# Patient Record
Sex: Male | Born: 1975
Health system: Southern US, Community
[De-identification: ages and names within clinical notes are randomized; demographics above are authoritative.]

## PROBLEM LIST (undated history)

## (undated) HISTORY — PX: HYDROCELE EXCISION / REPAIR: SUR1145

---

## 2010-11-27 ENCOUNTER — Other Ambulatory Visit: Payer: Self-pay | Admitting: Family Medicine

## 2010-11-27 DIAGNOSIS — K769 Liver disease, unspecified: Secondary | ICD-10-CM

## 2010-12-14 ENCOUNTER — Other Ambulatory Visit: Payer: Self-pay

## 2011-05-31 ENCOUNTER — Emergency Department (INDEPENDENT_AMBULATORY_CARE_PROVIDER_SITE_OTHER): Payer: PRIVATE HEALTH INSURANCE

## 2011-05-31 ENCOUNTER — Encounter (HOSPITAL_BASED_OUTPATIENT_CLINIC_OR_DEPARTMENT_OTHER): Payer: Self-pay | Admitting: *Deleted

## 2011-05-31 ENCOUNTER — Emergency Department (HOSPITAL_BASED_OUTPATIENT_CLINIC_OR_DEPARTMENT_OTHER)
Admission: EM | Admit: 2011-05-31 | Discharge: 2011-05-31 | Disposition: A | Discharge status: Home / Self Care | Payer: PRIVATE HEALTH INSURANCE | Attending: Emergency Medicine | Admitting: Emergency Medicine

## 2011-05-31 DIAGNOSIS — R220 Localized swelling, mass and lump, head: Secondary | ICD-10-CM | POA: Insufficient documentation

## 2011-05-31 DIAGNOSIS — R1310 Dysphagia, unspecified: Secondary | ICD-10-CM | POA: Insufficient documentation

## 2011-05-31 DIAGNOSIS — J029 Acute pharyngitis, unspecified: Secondary | ICD-10-CM

## 2011-05-31 DIAGNOSIS — R221 Localized swelling, mass and lump, neck: Secondary | ICD-10-CM

## 2011-05-31 LAB — RAPID STREP SCREEN (MED CTR MEBANE ONLY): Streptococcus, Group A Screen (Direct): NEGATIVE

## 2011-05-31 MED ORDER — ONDANSETRON HCL 4 MG/2ML IJ SOLN
4.0000 mg | Freq: Once | INTRAMUSCULAR | Status: AC
  Administered 2011-05-31: 4 mg via INTRAVENOUS

## 2011-05-31 MED ORDER — LORAZEPAM 2 MG/ML IJ SOLN
1.0000 mg | Freq: Once | INTRAMUSCULAR | Status: AC
  Administered 2011-05-31: 1 mg via INTRAVENOUS

## 2011-05-31 MED ORDER — ONDANSETRON HCL 4 MG/2ML IJ SOLN
INTRAMUSCULAR | Status: AC
  Administered 2011-05-31: 4 mg via INTRAVENOUS
  Filled 2011-05-31: qty 2

## 2011-05-31 MED ORDER — LORAZEPAM 2 MG/ML IJ SOLN
INTRAMUSCULAR | Status: AC
  Administered 2011-05-31: 1 mg via INTRAVENOUS
  Filled 2011-05-31: qty 1

## 2011-05-31 MED ORDER — IOHEXOL 300 MG/ML  SOLN
80.0000 mL | Freq: Once | INTRAMUSCULAR | Status: AC | PRN
  Administered 2011-05-31: 80 mL via INTRAVENOUS

## 2011-05-31 NOTE — ED Notes (Signed)
Pt states he was eating an apple about an hr ago and leaned forward to work on his computer. "Felt snap and it was hard to swallow for awhile". No distress.

## 2011-05-31 NOTE — Discharge Instructions (Signed)

## 2011-05-31 NOTE — ED Notes (Signed)
Pt appears to be highly anxious, wishes to wait on the strep screen for now.

## 2011-05-31 NOTE — ED Provider Notes (Signed)
History   This chart was scribed for Rolan Bucco, MD by Charolett Bumpers . The patient was seen in room MH06/MH06 and the patient's care was started at 9:29pm.    CSN: [id number]  Arrival date & time 05/31/11  2023   First MD Initiated Contact with Patient 05/31/11 2119      Chief Complaint  Patient presents with  . Sore Throat    (Consider location/radiation/quality/duration/timing/severity/associated sxs/prior treatment) HPI Marco Miles is a 36 y.o. male who presents to the Emergency Department complaining of constant, moderate sore throat with associated cough, nasal congestion, drainage, and nausea for the past few days. Patient states that tonight, PTA, he was eating an apple and leaned forward when he heard a "cracking noise". Patient states that ever since, he has had difficulty swallowing and tightness in his throat. Patient states that his sore throat was made worse after the "crack" was heard. Patient denies having a h/o similar problems. Patient denies SOB, fever, v/d and abdominal pain. No pertinent medical problems reported. No other symptoms reported.    PCP: Dr. Zachery Dauer at [facility]   History reviewed. No pertinent past medical history.  Past Surgical History  Procedure Date  . Hydrocele excision / repair     History reviewed. No pertinent family history.  History  Substance Use Topics  . Smoking status: Never Smoker   . Smokeless tobacco: Not on file  . Alcohol Use: Yes      Review of Systems A complete 10 system review of systems was obtained and all systems are negative except as noted in the HPI and PMH.   Allergies  Sudafed  Home Medications   Current Outpatient Rx  Name Route Sig Dispense Refill  . DM-GUAIFENESIN ER 30-600 MG PO TB12 Oral Take 1 tablet by mouth every 12 (twelve) hours. For congestion    . OMEGA-3 FATTY ACIDS 1000 MG PO CAPS Oral Take 2 g by mouth 2 (two) times daily.    . MULTI-VITAMIN/MINERALS PO TABS Oral  Take 1 tablet by mouth 2 (two) times daily.      BP 139/90  Pulse 82  Temp(Src) 97.8 F (36.6 C) (Oral)  Resp 20  Ht 5\' 10"  (1.778 m)  Wt 198 lb (89.812 kg)  BMI 28.41 kg/m2  SpO2 97%  Physical Exam  Nursing note and vitals reviewed. Constitutional: He is oriented to person, place, and time. He appears well-developed and well-nourished. No distress.       Patient mildly anxious upon exam.   HENT:  Head: Normocephalic and atraumatic.  Right Ear: External ear normal.  Left Ear: External ear normal.  Nose: Nose normal.  Mouth/Throat: Oropharynx is clear and moist. No oropharyngeal exudate.       No erythema noted on oropharynx. TM's normal bilaterally. Midline tenderness to neck.   Eyes: Conjunctivae and EOM are normal. Pupils are equal, round, and reactive to light.  Neck: Normal range of motion. Neck supple. No tracheal deviation present.  Cardiovascular: Normal rate, regular rhythm and normal heart sounds.  Exam reveals no gallop and no friction rub.   No murmur heard. Pulmonary/Chest: Effort normal and breath sounds normal. No respiratory distress. He has no wheezes. He has no rales.       No stridor.   Abdominal: Soft. Bowel sounds are normal. He exhibits no distension. There is no tenderness.  Musculoskeletal: Normal range of motion. He exhibits no edema.  Lymphadenopathy:    He has no cervical adenopathy.  Neurological: He  is alert and oriented to person, place, and time. No cranial nerve deficit or sensory deficit.  Skin: Skin is warm and dry.  Psychiatric: He has a normal mood and affect. His behavior is normal.    ED Course  Procedures (including critical care time)  DIAGNOSTIC STUDIES: Oxygen Saturation is 97% on room air, normal by my interpretation.    COORDINATION OF CARE:  2133: Discussed planned course of treatment with the patient who is agreeable at this time. Will order CT scan.     Labs Reviewed  RAPID STREP SCREEN   Ct Soft Tissue Neck W  Contrast  05/31/2011  *RADIOLOGY REPORT*  Clinical Data: Sore throat, difficulty swallowing, neck swelling  CT NECK WITH CONTRAST  Technique:  Multidetector CT imaging of the neck was performed with intravenous contrast.  Contrast: 80mL OMNIPAQUE IOHEXOL 300 MG/ML  SOLN  Comparison: None.  Findings: The visualized pharyngeal soft tissues are unremarkable. No evidence of tonsillar/peritonsillar abscess.  The airway remains patent.  No suspicious cervical lymphadenopathy.  The visualized thyroid is unremarkable.  The cervical spine is within normal limits.  The visualized lung apices are clear.  Tiny mucous retention cysts in the bilateral maxillary sinuses. The visualized paranasal sinuses and mastoid air cells are otherwise clear.  The visualized brain parenchyma is within normal limits.  IMPRESSION: Normal CT neck.  Original Report Authenticated By: Charline Bills, M.D.     1. Sore throat       MDM  No evidence of airway edema, infection or other neck abnormality.  Feel that pt's anxiety is contributing to symptoms   I personally performed the services described in this documentation, which was scribed in my presence.  The recorded information has been reviewed and considered.       Rolan Bucco, MD 05/31/11 2306

## 2011-07-11 ENCOUNTER — Ambulatory Visit: Payer: PRIVATE HEALTH INSURANCE | Admitting: Physical Therapy

## 2011-12-18 DIAGNOSIS — L739 Follicular disorder, unspecified: Secondary | ICD-10-CM | POA: Insufficient documentation

## 2013-10-12 IMAGING — CT CT NECK W/ CM
5 series · 16 of 33 positions shown, 18 images · IV contrast (omnipaque)
Comparison: None.

CLINICAL DATA: Sore throat, difficulty swallowing, neck swelling

CT NECK WITH CONTRAST
TECHNIQUE: Multidetector CT imaging of the neck was performed with
intravenous contrast.
Contrast: 80mL OMNIPAQUE IOHEXOL 300 MG/ML  SOLN

[Series 2: neck 2.0 b31s · axial · 0.46mm/px · z∈[-260,-176]mm · 2 of 126 slices shown (1 of 2)]
[im 42/126  bone]
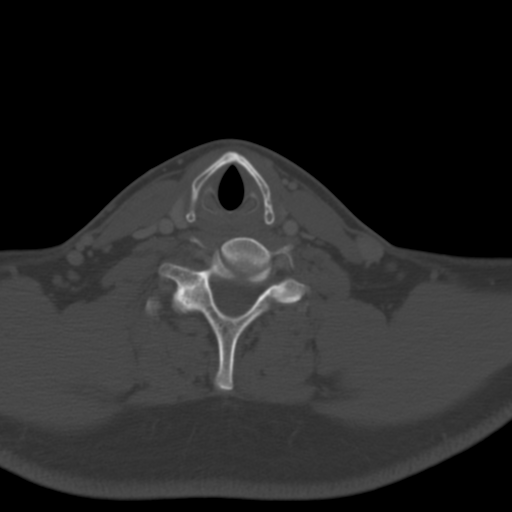
[im 84/126  bone]
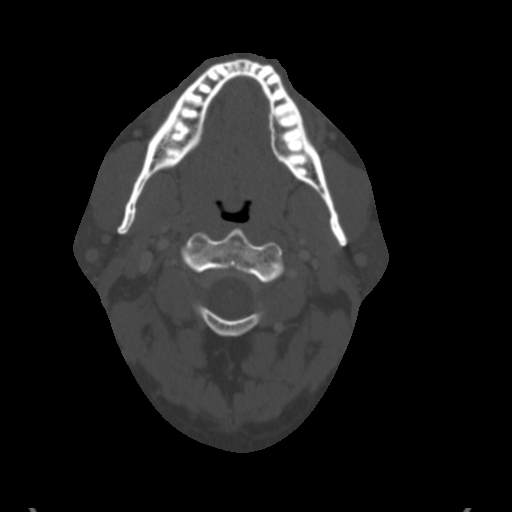

[Series 4: neck 2.0 coronal · coronal · 0.51mm/px · 3 of 112 slices shown]
[im 23/112  bone]
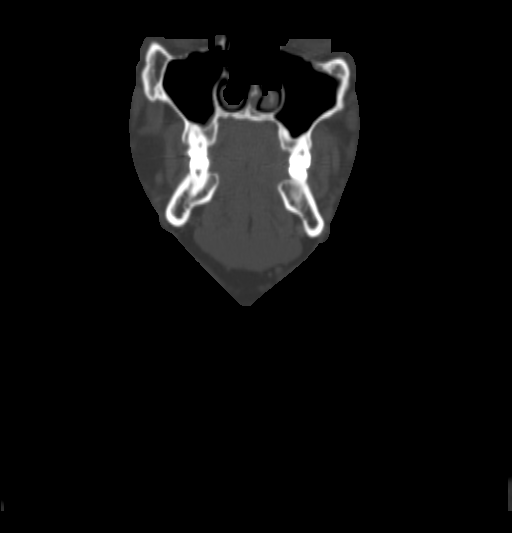
[im 45/112  bone]
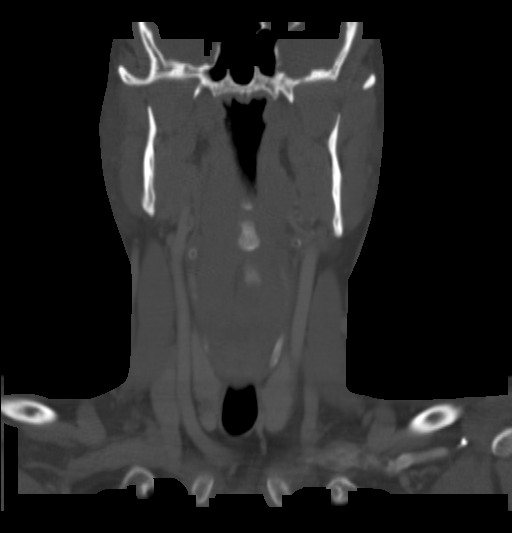
[im 67/112  bone]
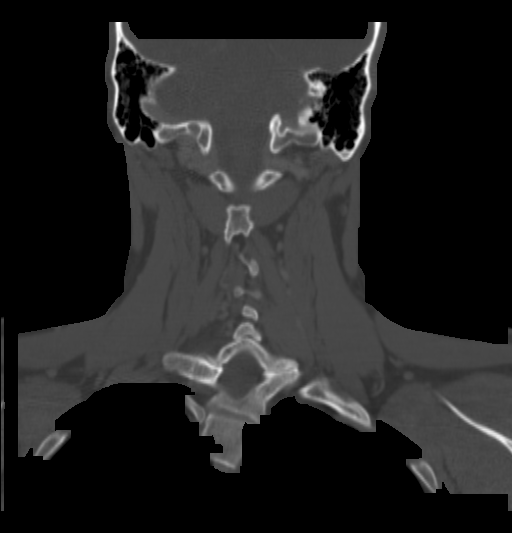

[Series 5: neck 2.0 sagittal · sagittal · 0.54mm/px · 5 of 84 slices shown, 6 images]
[im 28/84  bone]
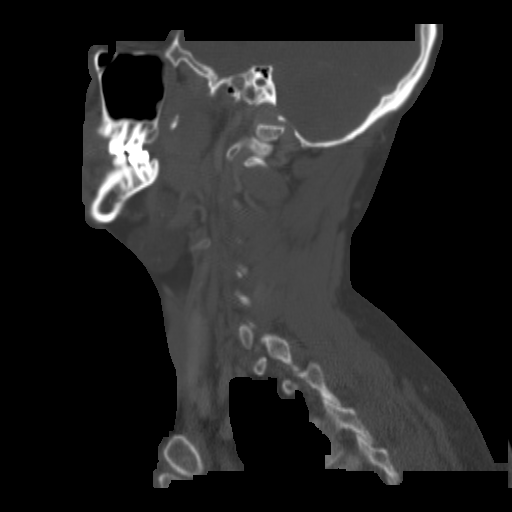
[im 35/84  bone]
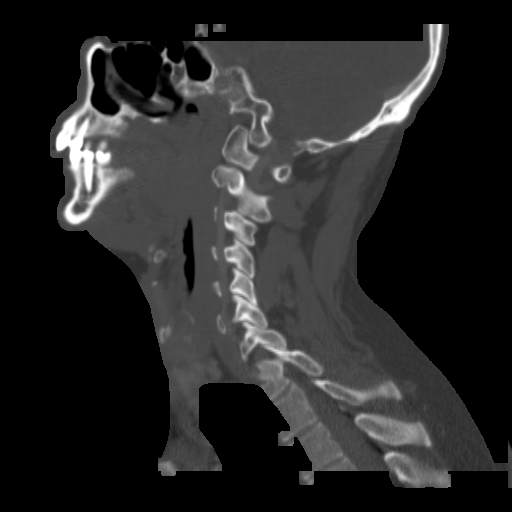
[im 42/84  soft-tissue]
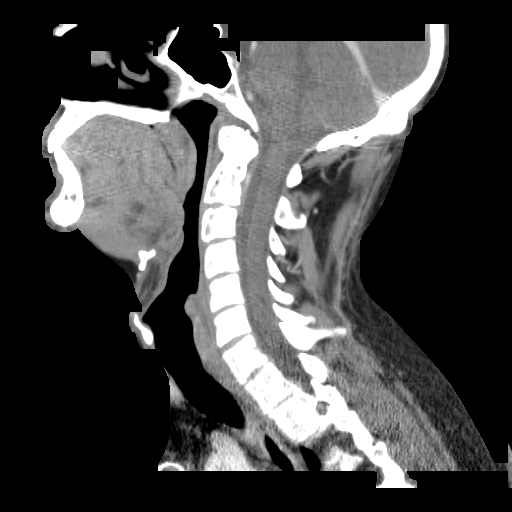
[im 42/84  bone]
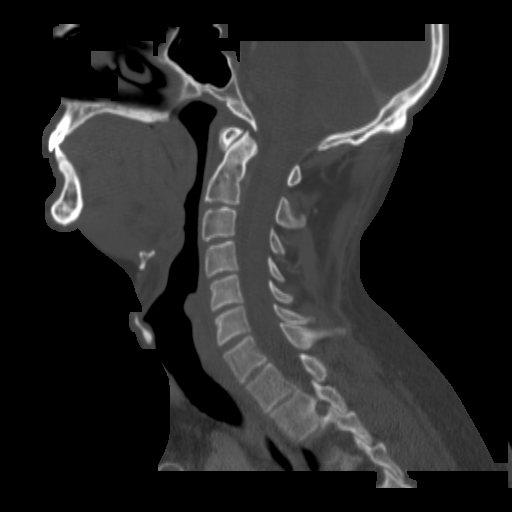
[im 49/84  bone]
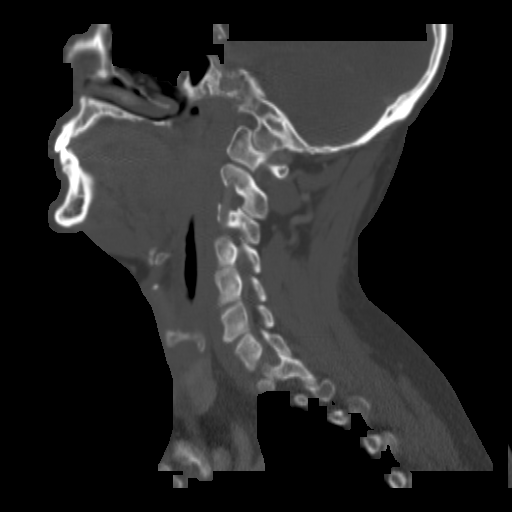
[im 56/84  bone]
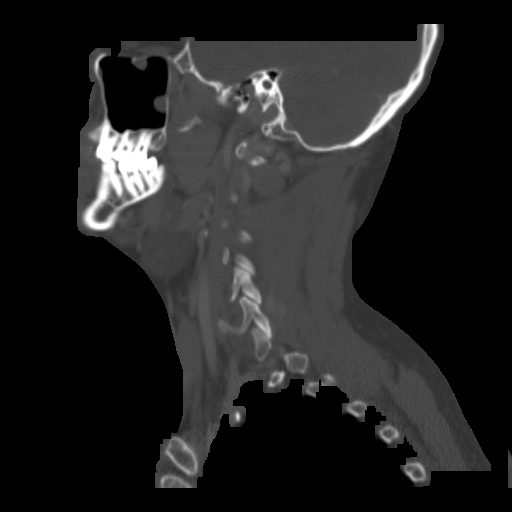

[Series 7: neck 2.0 orth axial to hyoid · axial · 0.39mm/px · z∈[-317,-191]mm · 3 of 138 slices shown, 4 images]
[im 35/138  soft-tissue]
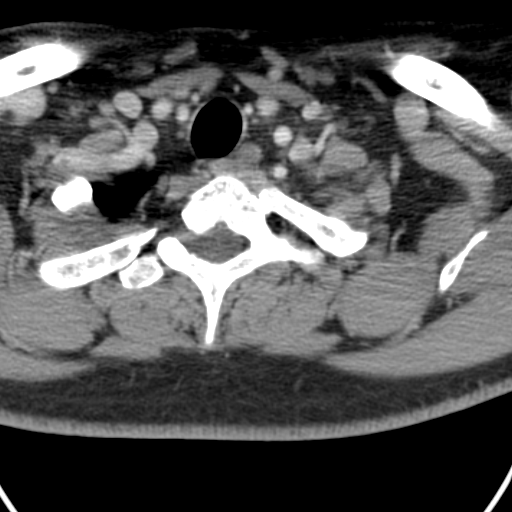
[im 35/138  bone]
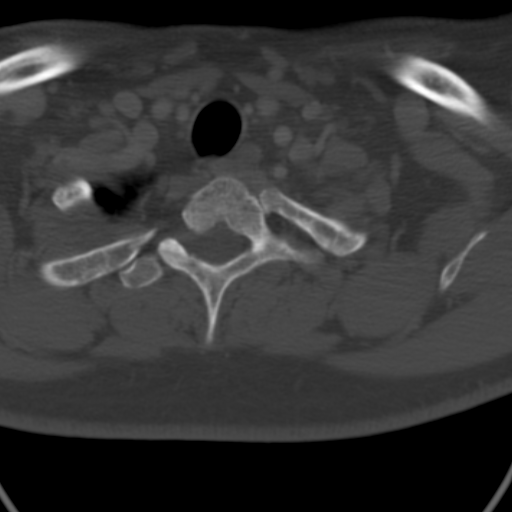
[im 69/138  bone]
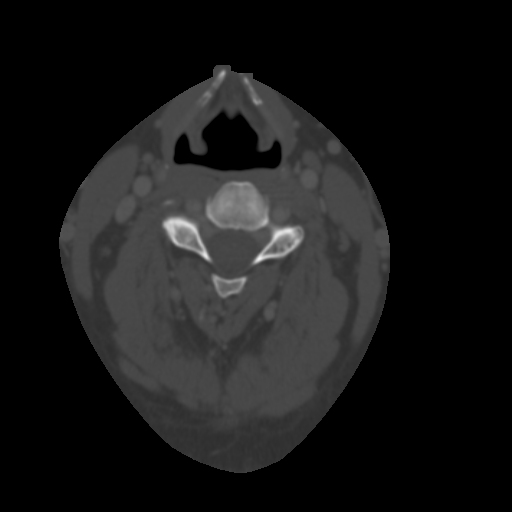
[im 103/138  bone]
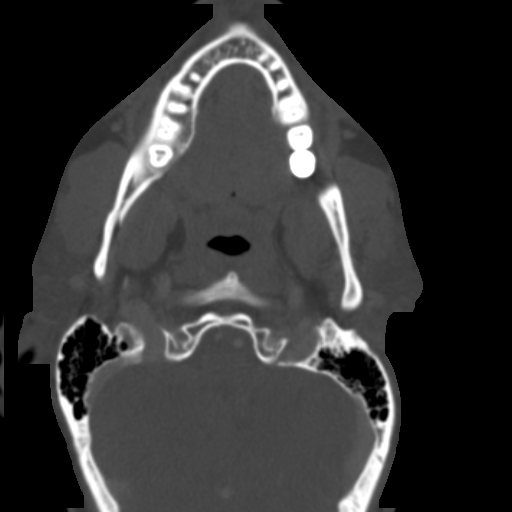

[Series 8: neck 2.0 b31s · axial · 0.54mm/px · z∈[-280,-156]mm · 3 of 126 slices shown (2 of 2)]
[im 32/126  bone]
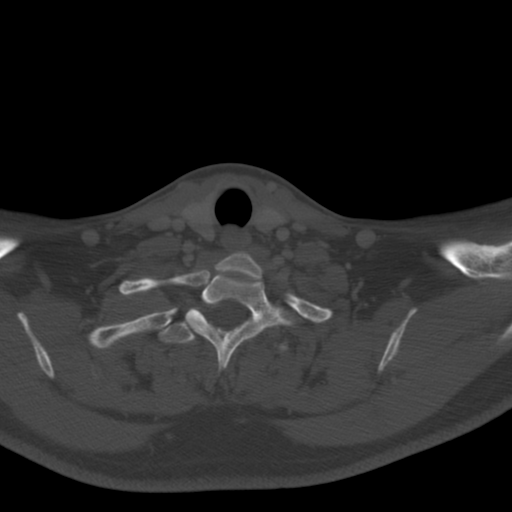
[im 63/126  bone]
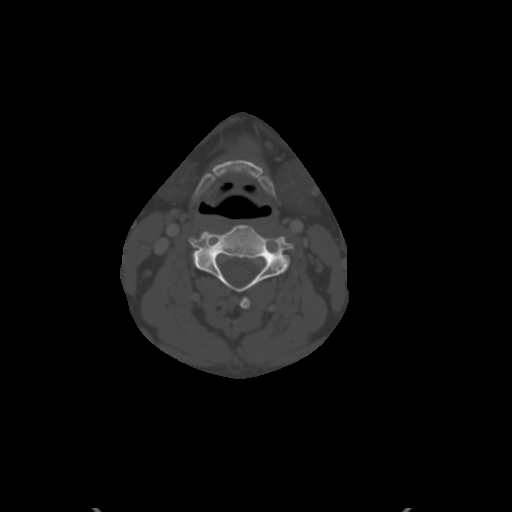
[im 94/126  bone]
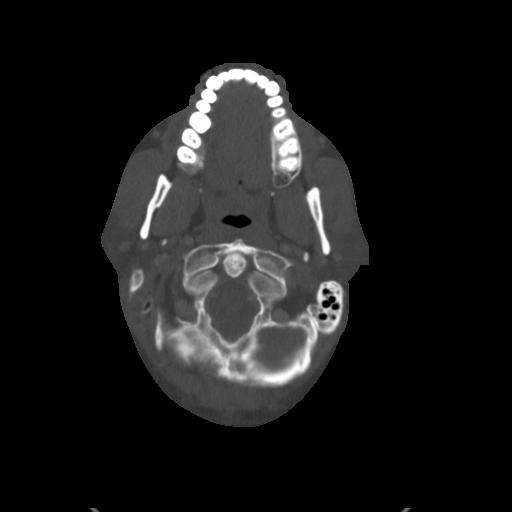

[16 of 33 positions shown; findings below may reference images not displayed]

FINDINGS: The visualized pharyngeal soft tissues are unremarkable.
No evidence of tonsillar/peritonsillar abscess.

The airway remains patent.

No suspicious cervical lymphadenopathy.

The visualized thyroid is unremarkable.

The cervical spine is within normal limits.

The visualized lung apices are clear.

Tiny mucous retention cysts in the bilateral maxillary sinuses.
The visualized paranasal sinuses and mastoid air cells are
otherwise clear.

The visualized brain parenchyma is within normal limits.
IMPRESSION: Normal CT neck.

## 2018-12-03 ENCOUNTER — Other Ambulatory Visit: Payer: Self-pay

## 2018-12-03 ENCOUNTER — Ambulatory Visit: Payer: Commercial Managed Care - PPO | Admitting: Podiatry

## 2018-12-03 ENCOUNTER — Encounter: Payer: Self-pay | Admitting: Podiatry

## 2018-12-03 DIAGNOSIS — B070 Plantar wart: Secondary | ICD-10-CM

## 2018-12-03 DIAGNOSIS — L309 Dermatitis, unspecified: Secondary | ICD-10-CM | POA: Diagnosis not present

## 2018-12-03 MED ORDER — FLUOROURACIL 5 % EX CREA: TOPICAL_CREAM | Freq: Two times a day (BID) | CUTANEOUS | 1 refills | Status: DC

## 2018-12-03 NOTE — Progress Notes (Signed)
   Subjective:    Patient ID: Marco Miles, male    DOB: 18-Feb-1975, 43 y.o.   MRN: [id number]  HPI    Review of Systems  All other systems reviewed and are negative.      Objective:   Physical Exam        Assessment & Plan:

## 2018-12-06 NOTE — Progress Notes (Signed)
Subjective:   Patient ID: Marco Miles, male   DOB: 43 y.o.   MRN: [id number]   HPI Patient presents stating he has had a lot of pain in the bottom of his left foot and also had some discoloration between several digits that they are starting cream.  States the bottom of the heel is very tender and its been going on now for around a year and gradual gotten worse over that time and patient does not smoke likes to be active   Review of Systems  All other systems reviewed and are negative.       Objective:  Physical Exam Vitals signs and nursing note reviewed.  Constitutional:      Appearance: He is well-developed.  Pulmonary:     Effort: Pulmonary effort is normal.  Musculoskeletal: Normal range of motion.  Skin:    General: Skin is warm.  Neurological:     Mental Status: He is alert.     Neurovascular status was found to be intact muscle strength was found to be adequate range of motion within normal limits.  Patient is noted to have a large lesion plantar aspect left which measures approximately centimeter by centimeter that upon debridement shows pinpoint bleeding pain to lateral pressure.  There is also slight discoloration between several digits but it is localized with no proximal edema erythema drainage noted and patient has good digital perfusion well oriented x3     Assessment:  Probability for verruca plantaris plantar aspect left with possibility for slight fungal infection between the interdigital area     Plan:  H&P all conditions reviewed discussed the written focus on the heel as he will start the medicines for between his toe prescribed by dermatologist.  I went ahead today debrided the tissue with sharp sterile instrumentation and then applied chemical agent to create immune response along with sterile dressing and gave instructions what to do if any blistering were to occur.  Patient also was given prescription for Efudex to begin using and will be seen back again in  1 month and probably will require several treatments and possible excision in future

## 2018-12-30 ENCOUNTER — Ambulatory Visit: Payer: Commercial Managed Care - PPO | Admitting: Podiatry

## 2018-12-30 ENCOUNTER — Encounter: Payer: Self-pay | Admitting: Podiatry

## 2018-12-30 ENCOUNTER — Other Ambulatory Visit: Payer: Self-pay

## 2018-12-30 DIAGNOSIS — B070 Plantar wart: Secondary | ICD-10-CM

## 2019-01-03 NOTE — Progress Notes (Signed)
Subjective:   Patient ID: Marco Miles, male   DOB: 43 y.o.   MRN: [id number]   HPI Patient states it seems to be improving and states that it is slowly getting better is not as painful as it was but I still know it is there    ROS      Objective:  Physical Exam  Neurovascular status intact with patient plantar aspect left heel showing a keratotic lesion that is improving but still has indications of verrucous material     Assessment:  Verruca plantaris left plantar heel medial side that is improving but present     Plan:  Sterile debridement of the lesion accomplished and I then went ahead and applied immune agent to create reaction along with sterile dressing and advised on leaving this on 24 hours and taken off earlier if any blistering which were to occur and what to do with blister were to occur.  I then went ahead and recommended continued Efudex usage at home and will be seen back as needed

## 2019-07-01 DIAGNOSIS — M9902 Segmental and somatic dysfunction of thoracic region: Secondary | ICD-10-CM | POA: Diagnosis not present

## 2019-07-01 DIAGNOSIS — M9906 Segmental and somatic dysfunction of lower extremity: Secondary | ICD-10-CM | POA: Diagnosis not present

## 2019-07-01 DIAGNOSIS — M9905 Segmental and somatic dysfunction of pelvic region: Secondary | ICD-10-CM | POA: Diagnosis not present

## 2019-07-01 DIAGNOSIS — M9903 Segmental and somatic dysfunction of lumbar region: Secondary | ICD-10-CM | POA: Diagnosis not present

## 2019-12-07 DIAGNOSIS — M9901 Segmental and somatic dysfunction of cervical region: Secondary | ICD-10-CM | POA: Diagnosis not present

## 2019-12-07 DIAGNOSIS — M9902 Segmental and somatic dysfunction of thoracic region: Secondary | ICD-10-CM | POA: Diagnosis not present

## 2019-12-07 DIAGNOSIS — M9905 Segmental and somatic dysfunction of pelvic region: Secondary | ICD-10-CM | POA: Diagnosis not present

## 2019-12-07 DIAGNOSIS — M9903 Segmental and somatic dysfunction of lumbar region: Secondary | ICD-10-CM | POA: Diagnosis not present

## 2019-12-21 DIAGNOSIS — M9901 Segmental and somatic dysfunction of cervical region: Secondary | ICD-10-CM | POA: Diagnosis not present

## 2019-12-21 DIAGNOSIS — M9902 Segmental and somatic dysfunction of thoracic region: Secondary | ICD-10-CM | POA: Diagnosis not present

## 2019-12-21 DIAGNOSIS — M9903 Segmental and somatic dysfunction of lumbar region: Secondary | ICD-10-CM | POA: Diagnosis not present

## 2019-12-21 DIAGNOSIS — M9905 Segmental and somatic dysfunction of pelvic region: Secondary | ICD-10-CM | POA: Diagnosis not present

## 2019-12-25 DIAGNOSIS — M791 Myalgia, unspecified site: Secondary | ICD-10-CM | POA: Diagnosis not present

## 2019-12-25 DIAGNOSIS — R5383 Other fatigue: Secondary | ICD-10-CM | POA: Diagnosis not present

## 2019-12-25 DIAGNOSIS — R002 Palpitations: Secondary | ICD-10-CM | POA: Diagnosis not present

## 2020-01-18 DIAGNOSIS — M9901 Segmental and somatic dysfunction of cervical region: Secondary | ICD-10-CM | POA: Diagnosis not present

## 2020-01-18 DIAGNOSIS — M9905 Segmental and somatic dysfunction of pelvic region: Secondary | ICD-10-CM | POA: Diagnosis not present

## 2020-01-18 DIAGNOSIS — M9902 Segmental and somatic dysfunction of thoracic region: Secondary | ICD-10-CM | POA: Diagnosis not present

## 2020-01-18 DIAGNOSIS — M9903 Segmental and somatic dysfunction of lumbar region: Secondary | ICD-10-CM | POA: Diagnosis not present

## 2020-01-23 ENCOUNTER — Other Ambulatory Visit: Payer: Self-pay

## 2020-01-23 ENCOUNTER — Ambulatory Visit (INDEPENDENT_AMBULATORY_CARE_PROVIDER_SITE_OTHER): Payer: BC Managed Care – PPO | Admitting: Podiatry

## 2020-01-23 ENCOUNTER — Encounter: Payer: Self-pay | Admitting: Podiatry

## 2020-01-23 DIAGNOSIS — B070 Plantar wart: Secondary | ICD-10-CM

## 2020-01-25 NOTE — Progress Notes (Signed)
Subjective:   Patient ID: Marco Miles, male   DOB: 44 y.o.   MRN: [id number]   HPI Patient states he has developed a lot of lesions on the bottom of his right foot that have been painful and they are much thinner than the ones we had worked on previously which did go away on his foot   ROS      Objective:  Physical Exam  Neurovascular status intact with lesions are mostly on the plantar aspect of the right hallux and into the metatarsal localized in nature with no other pathology noted     Assessment:  Verruca plantaris plantar aspect right     Plan:  H&P reviewed condition debrided the lesions and applied chemical agent to create response with sterile dressing.  Continue home Efudex usage and explained what to do if any blistering were to occur and reappoint if symptoms were to remain

## 2020-01-26 DIAGNOSIS — J22 Unspecified acute lower respiratory infection: Secondary | ICD-10-CM | POA: Diagnosis not present

## 2020-01-26 DIAGNOSIS — R059 Cough, unspecified: Secondary | ICD-10-CM | POA: Diagnosis not present

## 2020-02-06 DIAGNOSIS — H40013 Open angle with borderline findings, low risk, bilateral: Secondary | ICD-10-CM | POA: Diagnosis not present

## 2020-02-15 DIAGNOSIS — M9901 Segmental and somatic dysfunction of cervical region: Secondary | ICD-10-CM | POA: Diagnosis not present

## 2020-02-15 DIAGNOSIS — M9902 Segmental and somatic dysfunction of thoracic region: Secondary | ICD-10-CM | POA: Diagnosis not present

## 2020-02-15 DIAGNOSIS — M9905 Segmental and somatic dysfunction of pelvic region: Secondary | ICD-10-CM | POA: Diagnosis not present

## 2020-02-15 DIAGNOSIS — M9903 Segmental and somatic dysfunction of lumbar region: Secondary | ICD-10-CM | POA: Diagnosis not present

## 2020-05-16 DIAGNOSIS — H02889 Meibomian gland dysfunction of unspecified eye, unspecified eyelid: Secondary | ICD-10-CM | POA: Diagnosis not present

## 2021-01-29 DIAGNOSIS — H40013 Open angle with borderline findings, low risk, bilateral: Secondary | ICD-10-CM | POA: Diagnosis not present

## 2021-11-28 ENCOUNTER — Ambulatory Visit: Payer: 59 | Admitting: Podiatry

## 2021-11-29 ENCOUNTER — Encounter: Payer: Self-pay | Admitting: Podiatry

## 2021-11-29 ENCOUNTER — Ambulatory Visit (INDEPENDENT_AMBULATORY_CARE_PROVIDER_SITE_OTHER): Payer: 59 | Admitting: Podiatry

## 2021-11-29 DIAGNOSIS — B351 Tinea unguium: Secondary | ICD-10-CM

## 2021-11-29 NOTE — Progress Notes (Signed)
Subjective:   Patient ID: Marco Miles, male   DOB: 46 y.o.   MRN: [id number]   HPI Patient presents stating he had concerns about his nails being brittle and discoloration and wants to make sure he is not getting fungus eft   ROS      Objective:  Physical Exam  Neurovascular status was found to be intact muscle strength was adequate with patient's nailbed right over left hallux showing some distal debris but localized no other pathology noted     Assessment:  Probability that this is more related to nail trauma versus a issue of fungal infection even though there may be slight fungal infection present     Plan:  Reviewed condition and recommended that he just go conservative and just watch it but I do not think it will be an issue for him.  Patient encouraged to call with questions concerns which may arise

## 2021-12-04 ENCOUNTER — Ambulatory Visit: Payer: 59 | Admitting: Podiatry

## 2022-10-14 ENCOUNTER — Other Ambulatory Visit (HOSPITAL_COMMUNITY): Payer: Self-pay | Admitting: Internal Medicine

## 2022-10-14 DIAGNOSIS — E7800 Pure hypercholesterolemia, unspecified: Secondary | ICD-10-CM

## 2022-11-07 ENCOUNTER — Ambulatory Visit (HOSPITAL_BASED_OUTPATIENT_CLINIC_OR_DEPARTMENT_OTHER)
Admission: RE | Admit: 2022-11-07 | Discharge: 2022-11-07 | Disposition: A | Discharge status: Home / Self Care | Payer: PRIVATE HEALTH INSURANCE | Source: Ambulatory Visit | Attending: Internal Medicine | Admitting: Internal Medicine

## 2022-11-07 DIAGNOSIS — E7800 Pure hypercholesterolemia, unspecified: Secondary | ICD-10-CM | POA: Insufficient documentation

## 2022-11-19 ENCOUNTER — Ambulatory Visit (INDEPENDENT_AMBULATORY_CARE_PROVIDER_SITE_OTHER): Payer: 59 | Admitting: Podiatry

## 2022-11-19 ENCOUNTER — Encounter: Payer: Self-pay | Admitting: Podiatry

## 2022-11-19 VITALS — Ht 70.0 in | Wt 198.0 lb

## 2022-11-19 DIAGNOSIS — S90112A Contusion of left great toe without damage to nail, initial encounter: Secondary | ICD-10-CM

## 2022-11-19 DIAGNOSIS — L600 Ingrowing nail: Secondary | ICD-10-CM

## 2022-11-19 NOTE — Progress Notes (Signed)
Subjective:   Patient ID: Marco Miles, male   DOB: 47 y.o.   MRN: [id number]   HPI Patient presents stating he traumatized his left big toenail and his right big toenail has been ingrown and gets sore in the corner.  States he is tried to trim and soak it and has not been affected   ROS      Objective:  Physical Exam  Neurovascular status intact with an incurvated medial border right hallux that is painful when pressed down to the base no erythema edema or drainage noted and on the left is found to have an irritated nailbed that he trimmed too deeply with moderate distal contusion     Assessment:  Ingrown toenail deformity right hallux with pain mild contusion left big toe and history of mild fungal infection between the digits     Plan:  H&P reviewed all conditions.  For the right I have recommended correction of deformity explained procedure risk he signed consent form I infiltrated the right big toe 60 mg like Marcaine mixture sterile prep done using sterile instrumentation remove the border exposed matrix applied phenol 3 applications 30 seconds followed by alcohol lavage sterile dressing gave instructions on soaks and to wear dressing for 24 hours take it off earlier if throbbing were to occur.  Did advise Lamisil cream and the left hallux should heal uneventfully

## 2022-11-19 NOTE — Patient Instructions (Signed)

## 2022-12-24 ENCOUNTER — Ambulatory Visit (INDEPENDENT_AMBULATORY_CARE_PROVIDER_SITE_OTHER): Payer: 59 | Admitting: Otolaryngology

## 2022-12-24 ENCOUNTER — Encounter (INDEPENDENT_AMBULATORY_CARE_PROVIDER_SITE_OTHER): Payer: Self-pay | Admitting: Otolaryngology

## 2022-12-24 VITALS — BP 126/80 | HR 68 | Ht 70.0 in | Wt 175.0 lb

## 2022-12-24 DIAGNOSIS — J339 Nasal polyp, unspecified: Secondary | ICD-10-CM | POA: Diagnosis not present

## 2022-12-24 DIAGNOSIS — J342 Deviated nasal septum: Secondary | ICD-10-CM

## 2022-12-24 DIAGNOSIS — G4733 Obstructive sleep apnea (adult) (pediatric): Secondary | ICD-10-CM | POA: Diagnosis not present

## 2022-12-24 DIAGNOSIS — R0982 Postnasal drip: Secondary | ICD-10-CM | POA: Diagnosis not present

## 2022-12-24 DIAGNOSIS — J343 Hypertrophy of nasal turbinates: Secondary | ICD-10-CM

## 2022-12-24 DIAGNOSIS — R0981 Nasal congestion: Secondary | ICD-10-CM

## 2022-12-24 DIAGNOSIS — J3489 Other specified disorders of nose and nasal sinuses: Secondary | ICD-10-CM | POA: Diagnosis not present

## 2022-12-24 DIAGNOSIS — K219 Gastro-esophageal reflux disease without esophagitis: Secondary | ICD-10-CM

## 2022-12-24 DIAGNOSIS — J3089 Other allergic rhinitis: Secondary | ICD-10-CM

## 2022-12-24 MED ORDER — FLUTICASONE PROPIONATE 50 MCG/ACT NA SUSP: 2.0000 | Freq: Every day | NASAL | 6 refills | Status: DC

## 2022-12-24 MED ORDER — CETIRIZINE HCL 10 MG PO TABS: 10.0000 mg | ORAL_TABLET | Freq: Every day | ORAL | 11 refills | Status: AC

## 2022-12-24 NOTE — Progress Notes (Signed)
ENT Consult Note   Discussed the use of AI scribe software for clinical note transcription with the patient, who gave verbal consent to proceed.  History of Present Illness   The patient is a 5 yoM, previously diagnosed with sleep apnea, presented for a consultation regarding his struggles with CPAP therapy and chronic nasal congestion.  The initial diagnosis of OSA was made in April, following a sleep study that revealed an AHI of 56 (HST ordered by PCP and done at [facility]). The patient reported that this was his first formal diagnosis of sleep apnea, although he had taken a home sleep test approximately 10-12 years prior which indicated some sleep interruptions. At that time, he lost a significant amount of weight and did not pursue further treatment.  However, during a routine checkup in March 2024, the patient's blood pressure was found to be significantly elevated. Despite weight loss, his blood pressure remained high, leading to the recent sleep study and diagnosis of sleep apnea. Since late September, the patient has been using CPAP therapy consistently, mostly for at least four hours each night. He reported feeling better since starting the therapy and uses a nasal cannula/pillow mask due to claustrophobia and discomfort with a full mask.  The patient also mentioned a recent visit to an orthotics specialist who suggested he might have a deviated septum, leading the patient to seek consultation with an ENT specialist. The patient expressed interest in exploring alternatives to CPAP therapy, particularly due to the potential long-term commitment to the CPAP machine 2/2 his young age.  In addition to sleep apnea, the patient has a history of seasonal and environmental allergies, which he manages with occasional saline spray. The patient denied any history of sinus infections, postnasal drainage, or poor sense of smell. He is currently on amlodipine for blood pressure management and does not  take any other medications.      Past Medical/Surgical History History reviewed. No pertinent past medical history.   His  has a past surgical history that includes Hydrocele excision / repair.  Past Family/Social History His family history is not on file. He  reports that he has never smoked. He has never used smokeless tobacco. He reports current alcohol use. He reports that he does not use drugs.  Medications/Allergies/Immunizations Allergies: Pseudoephedrine and Sudafed [pseudoephedrine hcl], Immunizations:  There is no immunization history on file for this patient.  Review of Systems  ROS: Constitutional: Negative for fever, weight loss and weight gain. Cardiovascular: Negative for chest pain and dyspnea on exertion. Respiratory: Is not experiencing shortness of breath at rest. Gastrointestinal: Negative for nausea and vomiting. Neurological: Negative for headaches. Psychiatric: The patient is not nervous/anxious  OBJECTIVE:  Physical Exam Blood pressure 126/80, pulse 68, height 5\' 10"  (1.778 m), weight 175 lb (79.4 kg), SpO2 99%. General:  Well-developed, well-nourished, no apparent distress Respiratory Respiratory effort:  Equal inspiration and expiration without stridor Auscultation:  Equal breath sounds bilaterally Cardiovascular Heart:  regular rate and rhythm Peripheral Vascular:  Warm extremities with equal pulses Eyes: No nystagmus with equal extraocular motion bilaterally Neuro/Psych/Balance: Patient oriented to person, place, and time;  Appropriate mood and affect;  Gait is intact with no imbalance; Cranial nerves I-XII are intact Head and Face Inspection:  Normocephalic and atraumatic without mass or lesion Palpation:  Facial skeleton intact without bony stepoffs Salivary Glands:  No masses or tenderness Facial Strength:  Facial motility symmetric and full bilaterally ENT Pinna:  External ear intact and fully developed bilaterally External canal:  Canal  is patent with intact skin bilaterally Tympanic Membrane:  Clear and mobile bilaterally Hearing: Midline Weber, pos Rinne bilaterally, normal clinical speech reception threshold (whispered voice, finger rub) External nose:  No scar or anatomic deformity Internal Nose:  Septum intact and midline.  No edema, polyp, or rhinorrhea. Lips, Teeth, and gums:  Mucosa and teeth intact and viable TMJ:  No pain to palpation with full mobility Oral cavity/oropharynx:  No erythema or exudate, 1+ tonsils Tongue/palate position: Friedman 4 Nasal cavity/nasal passages: No purulence or significant secretions that might be cultured. There was a nasal polyp along the right middle turbinate. There was significant septal deviation and narrowing of the L > R nasal passage, nearly complete nasal obstruction on the left The mucosa was intact and there was no crusting present. Bilateral inferior turbinate hypertrophy present Nasopharynx:  No mass or lesion with intact mucosa Hypopharynx:  Intact mucosa without pooling of secretions Larynx:  Full true vocal cord mobility without lesion or mass, moderate post-cricoid edema/pachydermia Neck Neck and Trachea:  Midline trachea without mass or lesion Thyroid:  No mass or nodularity Lymphatics:  No lymphadenopathy  Preoperative diagnosis: OSA   Postoperative diagnosis:   Same + GERD LPR  Procedure: Flexible fiberoptic laryngoscopy  Surgeon: Ashok Croon, MD  Anesthesia: Topical lidocaine and Afrin Complications: None Condition is stable throughout exam  Indications and consent:  The patient presents to the clinic with Indirect laryngoscopy view was incomplete. Thus it was recommended that they undergo a flexible fiberoptic laryngoscopy. All of the risks, benefits, and potential complications were reviewed with the patient preoperatively and verbal informed consent was obtained.  Procedure: The patient was seated upright in the clinic. Topical lidocaine and Afrin  were applied to the nasal cavity. After adequate anesthesia had occurred, I then proceeded to pass the flexible telescope into the nasal cavity. The nasal cavity was patent without rhinorrhea or polyp. The nasopharynx was also patent without mass or lesion. The base of tongue was visualized and was normal. There were no signs of pooling of secretions in the piriform sinuses. The true vocal folds were mobile bilaterally. There were no signs of glottic or supraglottic mucosal lesion or mass. There was moderate interarytenoid pachydermia and post cricoid edema. The telescope was then slowly withdrawn and the patient tolerated the procedure throughout.    PROCEDURE NOTE: nasal endoscopy  Preoperative diagnosis: chronic  nasal congestion symptoms  Postoperative diagnosis: same + nasal polyp + NSD and ITH  Procedure: Diagnostic nasal endoscopy (40981)  Surgeon: Ashok Croon, M.D.  Anesthesia: Topical lidocaine and Afrin  H&P REVIEW: The patient's history and physical were reviewed today prior to procedure. All medications were reviewed and updated as well. Complications: None Condition is stable throughout exam Indications and consent: The patient presents with symptoms of chronic sinusitis not responding to previous therapies. All the risks, benefits, and potential complications were reviewed with the patient preoperatively and informed consent was obtained. The time out was completed with confirmation of the correct procedure.   Procedure: The patient was seated upright in the clinic. Topical lidocaine and Afrin were applied to the nasal cavity. After adequate anesthesia had occurred, the rigid nasal endoscope was passed into the nasal cavity. The nasal mucosa, turbinates, septum, and sinus drainage pathways were visualized bilaterally. This revealed no purulence or significant secretions that might be cultured. There was a nasal polyp along the right middle turbinate. There was significant  septal deviation and narrowing of the L > R nasal passage, nearly complete  nasal obstruction on the left. The mucosa was intact and there was no crusting present. The scope was then slowly withdrawn and the patient tolerated the procedure well. There were no complications or blood loss.    ASSESSMENT/PLAN: Encounter Diagnoses  Name Primary?   Nasal septal deviation    Chronic nasal congestion Yes   Environmental and seasonal allergies    Obstructive sleep apnea    Hypertrophy of both inferior nasal turbinates    Post-nasal drip    Nasal polyp    Nasal obstruction    Gastroesophageal reflux disease without esophagitis     Assessment and Plan    Obstructive Sleep Apnea (OSA) OSA diagnosed with an AHI of 56 in April 2024 (we do not have records of HST done with [facility]), using CPAP with nasal cannula ~ four hours nightly since late September, 2024, reporting some improvement. Interested in alternatives to CPAP due to long-term compliance and quality of life concerns. Discussed Inspire hypoglossal nerve stimulation as an alternative id he finds it impossible to tolerate CPAP in the future - Provided brochure and online resources about Inspire hypoglossal nerve stimulation - Discussed potential benefits and risks of Inspire - continue CPAP   Deviated Nasal Septum and inferior turbinate hypertrophy Significant septal deviation, especially on the left side, confirmed via nasal endoscopy. Contributes to nasal congestion and may affect CPAP compliance. Septoplasty can improve CPAP tolerance but will not cure sleep apnea. CT scan of the sinuses needed if surgery is considered to assess nasal polyp involvement and to rule out chronic sinusitis  - Start Flonase nasal spray 2 puffs b/l nares twice daily - Start Zyrtec 10 mg once daily before bed - Consider septoplasty if CPAP compliance does not improve - Order CT scan of sinuses if surgery is considered to evaluate nasal polyp on the right and  to rule out chronic sinus inflammation  Nasal Polyp Small nasal polyp observed on the right side during nasal endoscopy, likely related to chronic allergic inflammation. Polyp removal can be included in septoplasty if surgery is pursued. - Monitor polyp size and symptoms  Chronic nasal congestion suspected environmental allergies - medical management as above  General Health Maintenance No contraindications identified between new medications and current antihypertensive therapy. Advised to avoid alcohol due to its potential impact on sleep quality and sleep apnea. - Educated on the use of Flonase and Zyrtec - Advised to avoid alcohol due to its potential impact on sleep quality and sleep apnea  Follow-up - Schedule follow-up appointment in three months - Send prescriptions for Flonase and Zyrtec to pharmacy - Include link to [place] information in after-visit summary.

## 2022-12-24 NOTE — Patient Instructions (Addendum)
[email or web address]  This is the link to find a patient who has Inspire Implant to learn more about their experience  - start Flonase and Zyrtec for nasal congestion

## 2023-01-22 ENCOUNTER — Ambulatory Visit (INDEPENDENT_AMBULATORY_CARE_PROVIDER_SITE_OTHER): Payer: 59 | Admitting: Podiatry

## 2023-01-22 DIAGNOSIS — L03031 Cellulitis of right toe: Secondary | ICD-10-CM

## 2023-01-22 NOTE — Progress Notes (Signed)
Subjective:   Patient ID: Marco Miles, male   DOB: 47 y.o.   MRN: [id number]   HPI Patient presents concerned about some redness in the right big toe and states that he had ingrown removed 2 months ago   ROS      Objective:  Physical Exam  Neurovascular status intact mild amount of redness in the right hallux medial side with excellent healing from previous ingrown surgery     Assessment:  Probability for low-grade paronychia infection right big toe appears to be resolving      Plan:  H&P reviewed and went ahead today and advised on soaks and if it were to turn further red we will need to start antibiotics and may need to open the area.  Educated him on low-grade paronychia

## 2023-03-02 ENCOUNTER — Ambulatory Visit: Payer: 59 | Admitting: Podiatry

## 2023-03-26 ENCOUNTER — Ambulatory Visit (INDEPENDENT_AMBULATORY_CARE_PROVIDER_SITE_OTHER): Payer: PRIVATE HEALTH INSURANCE | Admitting: Otolaryngology

## 2023-04-03 ENCOUNTER — Ambulatory Visit (INDEPENDENT_AMBULATORY_CARE_PROVIDER_SITE_OTHER): Payer: 59 | Admitting: Otolaryngology

## 2023-04-03 ENCOUNTER — Encounter (INDEPENDENT_AMBULATORY_CARE_PROVIDER_SITE_OTHER): Payer: Self-pay | Admitting: Otolaryngology

## 2023-04-03 VITALS — BP 144/89 | HR 63

## 2023-04-03 DIAGNOSIS — J3089 Other allergic rhinitis: Secondary | ICD-10-CM

## 2023-04-03 DIAGNOSIS — J342 Deviated nasal septum: Secondary | ICD-10-CM

## 2023-04-03 DIAGNOSIS — G4733 Obstructive sleep apnea (adult) (pediatric): Secondary | ICD-10-CM | POA: Diagnosis not present

## 2023-04-03 DIAGNOSIS — R0982 Postnasal drip: Secondary | ICD-10-CM

## 2023-04-03 DIAGNOSIS — J343 Hypertrophy of nasal turbinates: Secondary | ICD-10-CM

## 2023-04-03 DIAGNOSIS — J3489 Other specified disorders of nose and nasal sinuses: Secondary | ICD-10-CM

## 2023-04-03 DIAGNOSIS — J339 Nasal polyp, unspecified: Secondary | ICD-10-CM

## 2023-04-03 DIAGNOSIS — R0981 Nasal congestion: Secondary | ICD-10-CM

## 2023-04-03 MED ORDER — FLUTICASONE PROPIONATE 50 MCG/ACT NA SUSP: 2.0000 | Freq: Every day | NASAL | 6 refills | Status: AC

## 2023-04-03 NOTE — Progress Notes (Signed)
ENT Progress Note:   Update 04/03/2023  Discussed the use of AI scribe software for clinical note transcription with the patient, who gave verbal consent to proceed.  History of Present Illness   Marco Miles is a 48 year old male with hx of sleep apnea on CPAP who presents for follow-up on CPAP therapy and nasal congestion symptoms.  He has been experiencing issues with CPAP machine inconsistently. The device sometimes falls off during the night, or he forgets to use it. His sleep has been sporadic, and he hasn't felt as rested lately, although he generally feels better. He has been using a dental device to move his jaw forward, which has caused some soreness. A deviated septum, diagnosed by a dentist, might be affecting his CPAP tolerance.  He has been taking cetirizine (Zyrtec) for about a month. He does not recall picking up Flonase from the pharmacy, which was previously prescribed.   He has lost over twenty pounds since July but feels that the weight loss has not significantly affected his sleep apnea.    Records Reviewed:  Initial Evaluation  Discussed the use of AI scribe software for clinical note transcription with the patient, who gave verbal consent to proceed.  History of Present Illness   The patient is a 80 yoM, previously diagnosed with sleep apnea, presented for a consultation regarding his struggles with CPAP therapy and chronic nasal congestion.  The initial diagnosis of OSA was made in April, following a sleep study that revealed an AHI of 56 (HST ordered by PCP and done at [facility]). The patient reported that this was his first formal diagnosis of sleep apnea, although he had taken a home sleep test approximately 10-12 years prior which indicated some sleep interruptions. At that time, he lost a significant amount of weight and did not pursue further treatment.  However, during a routine checkup in March 2024, the patient's blood pressure was found to be significantly  elevated. Despite weight loss, his blood pressure remained high, leading to the recent sleep study and diagnosis of sleep apnea. Since late September, the patient has been using CPAP therapy consistently, mostly for at least four hours each night. He reported feeling better since starting the therapy and uses a nasal cannula/pillow mask due to claustrophobia and discomfort with a full mask.  The patient also mentioned a recent visit to an orthotics specialist who suggested he might have a deviated septum, leading the patient to seek consultation with an ENT specialist. The patient expressed interest in exploring alternatives to CPAP therapy, particularly due to the potential long-term commitment to the CPAP machine 2/2 his young age.  In addition to sleep apnea, the patient has a history of seasonal and environmental allergies, which he manages with occasional saline spray. The patient denied any history of sinus infections, postnasal drainage, or poor sense of smell. He is currently on amlodipine for blood pressure management and does not take any other medications.      Past Medical/Surgical History History reviewed. No pertinent past medical history.   His  has a past surgical history that includes Hydrocele excision / repair.  Past Family/Social History His family history is not on file. He  reports that he has never smoked. He has never used smokeless tobacco. He reports current alcohol use. He reports that he does not use drugs.  Medications/Allergies/Immunizations Allergies: Pseudoephedrine and Sudafed [pseudoephedrine hcl], Immunizations:  There is no immunization history on file for this patient.  Review of Systems  ROS:  Constitutional: Negative for fever, weight loss and weight gain. Cardiovascular: Negative for chest pain and dyspnea on exertion. Respiratory: Is not experiencing shortness of breath at rest. Gastrointestinal: Negative for nausea and vomiting. Neurological:  Negative for headaches. Psychiatric: The patient is not nervous/anxious  OBJECTIVE:  Physical Exam Blood pressure (!) 144/89, pulse 63, SpO2 100%. General:  Well-developed, well-nourished, no apparent distress Respiratory Respiratory effort:  Equal inspiration and expiration without stridor Auscultation:  Equal breath sounds bilaterally Cardiovascular Heart:  regular rate and rhythm Peripheral Vascular:  Warm extremities with equal pulses Eyes: No nystagmus with equal extraocular motion bilaterally Neuro/Psych/Balance: Patient oriented to person, place, and time;  Appropriate mood and affect;  Gait is intact with no imbalance; Cranial nerves I-XII are intact Head and Face Inspection:  Normocephalic and atraumatic without mass or lesion Facial Strength:  Facial motility symmetric and full bilaterally ENT Pinna:  External ear intact and fully developed bilaterally External canal:  Canal is patent with intact skin bilaterally Tympanic Membrane:  Clear and mobile bilaterally External nose:  No scar or anatomic deformity Internal Nose:  Septum intact and midline.  No edema, polyp, or rhinorrhea. Lips, Teeth, and gums:  Mucosa and teeth intact and viable Oral cavity/oropharynx:  No erythema or exudate, 1+ tonsils Tongue/palate position: Friedman 4 Neck Neck and Trachea:  Midline trachea without mass or lesion Thyroid:  No mass or nodularity Lymphatics:  No lymphadenopathy  ASSESSMENT/PLAN: Encounter Diagnoses  Name Primary?   Nasal septal deviation Yes   Chronic nasal congestion    Environmental and seasonal allergies    Post-nasal drip    Hypertrophy of both inferior nasal turbinates    Obstructive sleep apnea    Nasal polyp    Nasal obstruction      Assessment and Plan    Obstructive Sleep Apnea (OSA) OSA diagnosed with an AHI of 56 in April 2024 (we do not have records of HST done with [facility]), using CPAP with nasal cannula ~ four hours nightly since late  September, 2024, reporting some improvement. Interested in alternatives to CPAP due to long-term compliance and quality of life concerns. Discussed Inspire hypoglossal nerve stimulation as an alternative is he finds it impossible to tolerate CPAP in the future - Provided brochure and online resources about Inspire hypoglossal nerve stimulation - Discussed potential benefits and risks of Inspire - continue CPAP   Deviated Nasal Septum and inferior turbinate hypertrophy Significant septal deviation, especially on the left side, confirmed via nasal endoscopy. Contributes to nasal congestion and may affect CPAP compliance. Septoplasty can improve CPAP tolerance but will not cure sleep apnea. CT scan of the sinuses needed if surgery is considered to assess nasal polyp involvement and to rule out chronic sinusitis  - Start Flonase nasal spray 2 puffs b/l nares twice daily - Start Zyrtec 10 mg once daily before bed - Consider septoplasty if CPAP compliance does not improve - Order CT scan of sinuses if surgery is considered to evaluate nasal polyp on the right and to rule out chronic sinus inflammation  Nasal Polyp Small nasal polyp observed on the right side during nasal endoscopy, likely related to chronic allergic inflammation. Polyp removal can be included in septoplasty if surgery is pursued. - Monitor polyp size and symptoms  Chronic nasal congestion suspected environmental allergies - medical management as above  General Health Maintenance No contraindications identified between new medications and current antihypertensive therapy. Advised to avoid alcohol due to its potential impact on sleep quality and sleep apnea. -  Educated on the use of Flonase and Zyrtec - Advised to avoid alcohol due to its potential impact on sleep quality and sleep apnea  Follow-up - Schedule follow-up appointment in three months - Send prescriptions for Flonase and Zyrtec to pharmacy - Include link to [employer]  information in after-visit summary.     Update 04/03/2023 Assessment and Plan    Obstructive Sleep Apnea (OSA) Chronic condition with current CPAP use/nasal pillow mask and oral appliance that seem to improve his level of tolerance and compliance. Reports some improvement with CPAP and mandibular advancement device but still experiences sporadic sleep quality.  Discussed potential benefits of septoplasty for nasal breathing and CPAP tolerance, but it will not cure OSA. Advised to continue current management and consider future surgical options if unable to tolerate CPAP. Weight loss can improve symptoms, but anatomical factors may prevent complete OSA resolution. Encouraged continued weight management and healthy lifestyle. - Continue CPAP therapy - Use mandibular advancement device - Consider future surgical options if CPAP becomes intolerable - Continue weight management and healthy lifestyle  Chronic Nasal Congestion and Environmental Allergies  He has a hx of septal deviation/ITH and a small nasal polyp and continues to have nasal congestion. Discussed use of Flonase to alleviate congestion and improve CPAP tolerance. Advised on proper administration technique to avoid septal dryness and epistaxis. - Flonase 2 puffs b/l nares BID - continue Zyrtec 10 mg daily - will consider septo/ITR in the future if sx will not improve   Follow-up - Follow up with [facility] for CPAP management - Return to ENT as needed.

## 2023-09-08 ENCOUNTER — Telehealth (INDEPENDENT_AMBULATORY_CARE_PROVIDER_SITE_OTHER): Payer: Self-pay | Admitting: Otolaryngology

## 2023-09-08 NOTE — Telephone Encounter (Signed)
 LVM to confirm appt & location [phone] [id number]

## 2023-09-10 ENCOUNTER — Encounter (INDEPENDENT_AMBULATORY_CARE_PROVIDER_SITE_OTHER): Payer: Self-pay | Admitting: Otolaryngology

## 2023-09-10 ENCOUNTER — Ambulatory Visit (INDEPENDENT_AMBULATORY_CARE_PROVIDER_SITE_OTHER): Admitting: Otolaryngology

## 2023-09-10 VITALS — BP 113/74 | HR 73

## 2023-09-10 DIAGNOSIS — G4733 Obstructive sleep apnea (adult) (pediatric): Secondary | ICD-10-CM

## 2023-09-10 DIAGNOSIS — H6123 Impacted cerumen, bilateral: Secondary | ICD-10-CM

## 2023-09-10 DIAGNOSIS — J3089 Other allergic rhinitis: Secondary | ICD-10-CM

## 2023-09-10 DIAGNOSIS — R0981 Nasal congestion: Secondary | ICD-10-CM

## 2023-09-10 NOTE — Progress Notes (Signed)
 ENT Progress Note  Update 09/10/2023  He feels he has cerumen impaction that he was not able to clear on its own. He has been having CPAP issues. Reports nasal congestion symptoms are better.   Records Reviewed:  Last OV 04/03/23  Obstructive Sleep Apnea (OSA) OSA diagnosed with an AHI of 56 in April 2024 (we do not have records of HST done with [facility]), using CPAP with nasal cannula ~ four hours nightly since late September, 2024, reporting some improvement. Interested in alternatives to CPAP due to long-term compliance and quality of life concerns. Discussed Inspire hypoglossal nerve stimulation as an alternative is he finds it impossible to tolerate CPAP in the future - Provided brochure and online resources about Inspire hypoglossal nerve stimulation - Discussed potential benefits and risks of Inspire - continue CPAP    Deviated Nasal Septum and inferior turbinate hypertrophy Significant septal deviation, especially on the left side, confirmed via nasal endoscopy. Contributes to nasal congestion and may affect CPAP compliance. Septoplasty can improve CPAP tolerance but will not cure sleep apnea. CT scan of the sinuses needed if surgery is considered to assess nasal polyp involvement and to rule out chronic sinusitis  - Start Flonase  nasal spray 2 puffs b/l nares twice daily - Start Zyrtec  10 mg once daily before bed - Consider septoplasty if CPAP compliance does not improve - Order CT scan of sinuses if surgery is considered to evaluate nasal polyp on the right and to rule out chronic sinus inflammation   Nasal Polyp Small nasal polyp observed on the right side during nasal endoscopy, likely related to chronic allergic inflammation. Polyp removal can be included in septoplasty if surgery is pursued. - Monitor polyp size and symptoms   Chronic nasal congestion suspected environmental allergie   History reviewed. No pertinent past medical history.  Past Surgical History:   Procedure Laterality Date   HYDROCELE EXCISION / REPAIR      History reviewed. No pertinent family history.  Social History:  reports that he has never smoked. He has never used smokeless tobacco. He reports current alcohol use. He reports that he does not use drugs.  Allergies:  Allergies  Allergen Reactions   Pseudoephedrine    Sudafed [Pseudoephedrine Hcl] Anxiety    Medications: I have reviewed the patient's current medications.  The PMH, PSH, Medications, Allergies, and SH were reviewed and updated.  ROS: Constitutional: Negative for fever, weight loss and weight gain. Cardiovascular: Negative for chest pain and dyspnea on exertion. Respiratory: Is not experiencing shortness of breath at rest. Gastrointestinal: Negative for nausea and vomiting. Neurological: Negative for headaches. Psychiatric: The patient is not nervous/anxious  Blood pressure 113/74, pulse 73, SpO2 97%.  PHYSICAL EXAM:  Exam: General: Well-developed, well-nourished Respiratory Respiratory effort: Equal inspiration and expiration without stridor Cardiovascular Peripheral Vascular: Warm extremities with equal color/perfusion Eyes: No nystagmus with equal extraocular motion bilaterally Neuro/Psych/Balance: Patient oriented to person, place, and time; Appropriate mood and affect; Gait is intact with no imbalance; Cranial nerves I-XII are intact Head and Face Inspection: Normocephalic and atraumatic without mass or lesion Palpation: Facial skeleton intact without bony stepoffs Salivary Glands: No mass or tenderness Facial Strength: Facial motility symmetric and full bilaterally ENT Pinna: External ear intact and fully developed External canal: Canal is patent with intact skin Tympanic Membrane: Clear and mobile after cerumen impaction removal  External Nose: No scar or anatomic deformity Internal Nose: Septum intact and midline. No edema, polyp, or rhinorrhea Lips, Teeth, and gums: Mucosa and  teeth  intact and viable TMJ: No pain to palpation with full mobility Oral cavity/oropharynx: No erythema or exudate, no lesions present Neck Neck and Trachea: Midline trachea without mass or lesion Thyroid: No mass or nodularity Lymphatics: No lymphadenopathy  Procedure: Procedure: Cerumen Removal, Bilateral (CPT [id number])  Diagnosis: cerumen impaction, bilateral   Informed consent: Timeout performed and informed consent was obtained.  Procedure: Operating microscope was employed to evaluate the ear(s).  Cerumen curette, speculum and suction were employed to clear the cerumen.   Findings: Normal appearing tympanic membranes without perforations, and external canals are normal after removal of cerumen.No middle ear fluid bilaterally.   Complications: None. Patient tolerated well.    Assessment/Plan: Encounter Diagnoses  Name Primary?   Bilateral impacted cerumen Yes   Chronic nasal congestion    Environmental and seasonal allergies     OSA Continue CPAP use and see Sleep Medicine for issues with CPAP   Chronic nasal congestion - continue Zyrtec  and Flonase    Cerumen Impaction, b/l S/p cerumen removal today, TM normal bilaterally - Sweet Oil as needed for cerumen impaction in the future    Thank you for allowing me to participate in the care of this patient. Please do not hesitate to contact me with any questions or concerns.   Elena Larry, MD Otolaryngology [facility] Phone: [phone] Fax: [phone]    09/10/2023, 4:10 PM
